# Patient Record
Sex: Female | Born: 2000
Health system: Southern US, Community
[De-identification: ages and names within clinical notes are randomized; demographics above are authoritative.]

## PROBLEM LIST (undated history)

## (undated) DIAGNOSIS — N926 Irregular menstruation, unspecified: Secondary | ICD-10-CM

## (undated) HISTORY — DX: Irregular menstruation, unspecified: N92.6

## (undated) HISTORY — PX: ADENOIDECTOMY: SUR15

## (undated) HISTORY — PX: OTHER SURGICAL HISTORY: SHX169

---
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
## 2022-04-19 DIAGNOSIS — B349 Viral infection, unspecified: Secondary | ICD-10-CM | POA: Diagnosis not present

## 2022-04-19 DIAGNOSIS — H9203 Otalgia, bilateral: Secondary | ICD-10-CM | POA: Diagnosis not present

## 2022-04-19 DIAGNOSIS — R0981 Nasal congestion: Secondary | ICD-10-CM | POA: Diagnosis not present

## 2022-04-19 DIAGNOSIS — R051 Acute cough: Secondary | ICD-10-CM | POA: Diagnosis not present

## 2022-05-19 ENCOUNTER — Ambulatory Visit (HOSPITAL_BASED_OUTPATIENT_CLINIC_OR_DEPARTMENT_OTHER): Payer: Federal, State, Local not specified - PPO | Admitting: Obstetrics & Gynecology

## 2022-05-19 ENCOUNTER — Encounter (HOSPITAL_BASED_OUTPATIENT_CLINIC_OR_DEPARTMENT_OTHER): Payer: Self-pay | Admitting: Obstetrics & Gynecology

## 2022-05-19 VITALS — BP 103/72 | HR 70 | Ht 66.0 in | Wt 112.2 lb

## 2022-05-19 DIAGNOSIS — N926 Irregular menstruation, unspecified: Secondary | ICD-10-CM | POA: Diagnosis not present

## 2022-05-19 NOTE — Progress Notes (Signed)
GYNECOLOGY  VISIT  CC:   irregular bleeding  HPI: 21 y.o. G0 Single White or Caucasian female here for new patient appointment.  Cycles started around age 78.  Cycles were pretty normal and regular until this year.  For several months, cycles have not been regular.  She has bleeding about every 14 days.  Bleeding episodes last 5-7.  The first 4 days are heavy and then it becomes lighter.  She does have some cramping.  She takes ibuprofen for this.  Feels the last month or so are more regular.  Has read about possible 'natural treatment' with castor oil.  Advised not to use this due to GI side effects and no evidence that I am aware of indicating this will help.  Denies bowel and bladder symptoms.  Never SA.     History reviewed. No pertinent past medical history.  MEDS:   No current outpatient medications on file prior to visit.   No current facility-administered medications on file prior to visit.    ALLERGIES: Patient has no known allergies.  SH:  single, non smoker  Review of Systems  Constitutional: Negative.   Genitourinary:        Irregular menstrual cycles  All other systems reviewed and are negative.   PHYSICAL EXAMINATION:    BP 103/72 (BP Location: Right Arm, Patient Position: Sitting, Cuff Size: Normal)   Pulse 70   Ht 5\' 6"  (1.676 m) Comment: Reported  Wt 112 lb 3.2 oz (50.9 kg)   LMP 05/09/2022 (Approximate)   BMI 18.11 kg/m     General appearance: alert, cooperative and appears stated age Neck: no adenopathy, supple, symmetrical, trachea midline and thyroid normal to inspection and palpation CV:  Regular rate and rhythm Lungs:  clear to auscultation, no wheezes, rales or rhonchi, symmetric air entry  Assessment/Plan: 1. Irregular bleeding - causes of irregular bleeding discussed.  Treatment options discussed.  Pt is not interested in any hormonal therapy at this point.  We did discussed progesterone options as well as combination low dosed OCPs.  Work up  also discussed.  Pt never SA.  Possible ultrasound reviewed as well (transabdominally only).  Pt willing to have TSH done today.  Wants to consider options and then let me know. - TSH

## 2022-05-20 LAB — TSH: TSH: 2.87 u[IU]/mL (ref 0.450–4.500)

## 2022-06-08 ENCOUNTER — Ambulatory Visit (HOSPITAL_BASED_OUTPATIENT_CLINIC_OR_DEPARTMENT_OTHER): Payer: Federal, State, Local not specified - PPO

## 2022-06-08 ENCOUNTER — Ambulatory Visit (HOSPITAL_BASED_OUTPATIENT_CLINIC_OR_DEPARTMENT_OTHER): Payer: Federal, State, Local not specified - PPO | Admitting: Obstetrics & Gynecology

## 2022-06-08 ENCOUNTER — Other Ambulatory Visit (HOSPITAL_BASED_OUTPATIENT_CLINIC_OR_DEPARTMENT_OTHER): Payer: Self-pay | Admitting: Obstetrics & Gynecology

## 2022-06-08 VITALS — BP 106/65 | HR 83 | Ht 66.0 in | Wt 118.8 lb

## 2022-06-08 DIAGNOSIS — N926 Irregular menstruation, unspecified: Secondary | ICD-10-CM

## 2022-06-08 DIAGNOSIS — N83202 Unspecified ovarian cyst, left side: Secondary | ICD-10-CM

## 2022-06-11 ENCOUNTER — Encounter (HOSPITAL_BASED_OUTPATIENT_CLINIC_OR_DEPARTMENT_OTHER): Payer: Self-pay | Admitting: Obstetrics & Gynecology

## 2022-06-11 NOTE — Progress Notes (Signed)
GYNECOLOGY  VISIT  CC:   follow up after ultrasound, irregular bleeding  HPI: 22 y.o. G0P0000 Single White or Caucasian female here for discussion of ultrasound findings due to irregular bleeding.  Cycle was normal normal this past month.  She really does not want to be on any hormonal therapy if possible.  Uterus 6 x 5 x 3cm with 8.47mm trilaminar endometrium.  Ovaries on right normal.  Left ovary 4 x 4 x 4.5cm with 3.4 x 3.6 x 3.7cm complex cyst.  Normal blood flow to ovary.  Cyst is complex.  Feel this should be monitored for resolution.  Images reviewed with pt today.  Findings discussed.  Recommended follow up discussed.    Also discussed that sometime OCPs can help with cyst resolution as well as prevention of future ones.  Ms. Lowdermilk would like to wait and seen at next follow up before deciding to start any therapy.   No past medical history on file.  MEDS:   No current outpatient medications on file prior to visit.   No current facility-administered medications on file prior to visit.    ALLERGIES: Patient has no known allergies.  SH:  single, non smoker  Review of Systems  Constitutional: Negative.   Genitourinary:        Irregular bleeding    PHYSICAL EXAMINATION:    BP 106/65   Pulse 83   Ht 5\' 6"  (1.676 m)   Wt 118 lb 12.8 oz (53.9 kg)   LMP 06/08/2022 (Approximate)   BMI 19.17 kg/m     General appearance: alert, cooperative and appears stated age  Assessment/Plan: 1. Left ovarian cyst - will plan to repeat ultrasound in 2-3 months.  Order placed and follow up will be scheduled - US PELVIS TRANSVAGINAL NON-OB (TV ONLY); Future

## 2022-07-27 ENCOUNTER — Ambulatory Visit (INDEPENDENT_AMBULATORY_CARE_PROVIDER_SITE_OTHER): Payer: Federal, State, Local not specified - PPO | Admitting: Obstetrics & Gynecology

## 2022-07-27 ENCOUNTER — Ambulatory Visit (INDEPENDENT_AMBULATORY_CARE_PROVIDER_SITE_OTHER): Payer: Federal, State, Local not specified - PPO

## 2022-07-27 ENCOUNTER — Encounter (HOSPITAL_BASED_OUTPATIENT_CLINIC_OR_DEPARTMENT_OTHER): Payer: Self-pay | Admitting: Obstetrics & Gynecology

## 2022-07-27 VITALS — BP 123/68 | HR 81 | Ht 66.0 in | Wt 117.4 lb

## 2022-07-27 DIAGNOSIS — N926 Irregular menstruation, unspecified: Secondary | ICD-10-CM | POA: Diagnosis not present

## 2022-07-27 DIAGNOSIS — N83202 Unspecified ovarian cyst, left side: Secondary | ICD-10-CM | POA: Diagnosis not present

## 2022-07-27 NOTE — Progress Notes (Signed)
GYNECOLOGY  VISIT  CC:   discuss ultrasound results  HPI: 22 y.o. G0P0000 Single White or Caucasian female here for recheck after having ultrasound today.  Pt has hx of irregular bleeding.  Pt was offered OCPs or POPs to help with cycle irregularities but doesn't want to be on hormonal therapy.  Ultrasound was obtained which showed normal uterus but left ovary had a 3.7cm complex cyst.  Repeat ultrasound recommended.  This was done today and is normal with resolution of cyst.  Findings discussed.  Pt feels comfortable continuing to not be on any hormonal therapy.  Reasons to call for recheck discussed.     No past medical history on file.  MEDS:   No current outpatient medications on file prior to visit.   No current facility-administered medications on file prior to visit.    ALLERGIES: Patient has no known allergies.  SH:  single, non smoker  Review of Systems  Constitutional: Negative.     PHYSICAL EXAMINATION:    BP 123/68 (BP Location: Right Arm, Patient Position: Sitting, Cuff Size: Normal)   Pulse 81   Ht 5\' 6"  (1.676 m) Comment: Reported  Wt 117 lb 6.4 oz (53.3 kg)   BMI 18.95 kg/m     General appearance: alert, cooperative and appears stated age   Assessment/Plan: 1. Left ovarian cyst - resolution of this noted with ultrasound today.  For now, will continue to follow pt conservatively.

## 2022-07-30 ENCOUNTER — Encounter (HOSPITAL_BASED_OUTPATIENT_CLINIC_OR_DEPARTMENT_OTHER): Payer: Self-pay | Admitting: Obstetrics & Gynecology

## 2022-07-30 DIAGNOSIS — N926 Irregular menstruation, unspecified: Secondary | ICD-10-CM | POA: Insufficient documentation
